# Patient Record
Sex: Male | Born: 2005
Health system: Southern US, Community
[De-identification: ages and names within clinical notes are randomized; demographics above are authoritative.]

## PROBLEM LIST (undated history)

## (undated) HISTORY — PX: CIRCUMCISION: SUR203

---
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
## 2024-02-06 DIAGNOSIS — Z0000 Encounter for general adult medical examination without abnormal findings: Secondary | ICD-10-CM | POA: Diagnosis not present

## 2024-02-15 ENCOUNTER — Encounter (INDEPENDENT_AMBULATORY_CARE_PROVIDER_SITE_OTHER): Payer: Self-pay | Admitting: Otolaryngology

## 2024-02-15 ENCOUNTER — Ambulatory Visit (INDEPENDENT_AMBULATORY_CARE_PROVIDER_SITE_OTHER): Admitting: Otolaryngology

## 2024-02-15 VITALS — BP 115/74 | HR 59 | Ht 75.0 in | Wt 165.0 lb

## 2024-02-15 DIAGNOSIS — J3089 Other allergic rhinitis: Secondary | ICD-10-CM | POA: Diagnosis not present

## 2024-02-15 DIAGNOSIS — K219 Gastro-esophageal reflux disease without esophagitis: Secondary | ICD-10-CM

## 2024-02-15 DIAGNOSIS — R0982 Postnasal drip: Secondary | ICD-10-CM

## 2024-02-15 DIAGNOSIS — J343 Hypertrophy of nasal turbinates: Secondary | ICD-10-CM

## 2024-02-15 DIAGNOSIS — J3501 Chronic tonsillitis: Secondary | ICD-10-CM

## 2024-02-15 DIAGNOSIS — R0981 Nasal congestion: Secondary | ICD-10-CM | POA: Diagnosis not present

## 2024-02-15 DIAGNOSIS — J351 Hypertrophy of tonsils: Secondary | ICD-10-CM

## 2024-02-15 DIAGNOSIS — Z8709 Personal history of other diseases of the respiratory system: Secondary | ICD-10-CM

## 2024-02-15 DIAGNOSIS — J342 Deviated nasal septum: Secondary | ICD-10-CM

## 2024-02-15 DIAGNOSIS — J312 Chronic pharyngitis: Secondary | ICD-10-CM

## 2024-02-15 MED ORDER — FLUTICASONE PROPIONATE 50 MCG/ACT NA SUSP: 2.0000 | Freq: Two times a day (BID) | NASAL | 6 refills | Status: AC

## 2024-02-15 NOTE — Patient Instructions (Addendum)
 Lloyd Huger Med Nasal Saline Rinse   - start nasal saline rinses with NeilMed Bottle available over the counter or online to help with nasal congestion     [email or web address] - check out this website to learn more about reflux   -Avoid lying down for at least two hours after a meal or after drinking acidic beverages, like soda, or other caffeinated beverages. This can help to prevent stomach contents from flowing back into the esophagus. -Keep your head elevated while you sleep. Using an extra pillow or two can also help to prevent reflux. -Eat smaller and more frequent meals each day instead of a few large meals. This promotes digestion and can aid in preventing heartburn. -Wear loose-fitting clothes to ease pressure on the stomach, which can worsen heartburn and reflux. -Reduce excess weight around the midsection. This can ease pressure on the stomach. Such pressure can force some stomach contents back up the esophagus  - Take Reflux Gourmet (natural supplement available on [employer]) to help with symptoms of chronic throat irritation

## 2024-02-15 NOTE — Progress Notes (Signed)
 ENT CONSULT:  Reason for Consult: chronic tonsillitis and hx of left PTA   HPI: Discussed the use of AI scribe software for clinical note transcription with the patient, who gave verbal consent to proceed.  History of Present Illness Caleb Bruce is a 18 year old male w/o major medical problems who presents with a history of peritonsillar abscess on the left and recurrent pharyngitis.   He had a peritonsillar abscess in February, which was successfully drained. This was his first occurrence, and he has not experienced any subsequent abscesses.  In May, he experienced pharyngitis, marking another rare episode of throat infection. He does not frequently experience sore throats or pharyngitis.  He manages his allergies with Allegra, taken during the fall and spring seasons. He experiences significant postnasal drainage. He has been noted to snore at night. No frequent strep throat infections are reported.   Records Reviewed:  ED note from 09/05/23 Caleb Bruce is a 18 y.o. male.   Pt complains of a sore throat and swelling.  Pt began having a sore throat 3 days ago.  Pt was seen and tested for influenza.  Pt had a positive test and was started on tamiflu.  Pt reports swelling in his throat yesterday.  Pt seen at urgent car and sent here.     The history is provided by the patient and a relative.  Sore Throat This is a new problem. The current episode started more than 2 days ago. The problem occurs constantly. Nothing aggravates the symptoms. Nothing relieves the symptoms. He has tried nothing for the symptoms. The treatment provided moderate relief.    Seen for pharyngitis on 12/18/23 at [facility]    No past medical history on file.  Past Surgical History:  Procedure Laterality Date   CIRCUMCISION      Family History  Problem Relation Age of Onset   Migraines Maternal Grandmother    Seizures Neg Hx    Autism Neg Hx    ADD / ADHD Neg Hx    Anxiety disorder Neg Hx     Depression Neg Hx    Bipolar disorder Neg Hx    Schizophrenia Neg Hx     Social History:  reports that he has never smoked. He has never used smokeless tobacco. No history on file for alcohol use and drug use.  Allergies: No Known Allergies  Medications: I have reviewed the patient's current medications.  The PMH, PSH, Medications, Allergies, and SH were reviewed and updated.  ROS: Constitutional: Negative for fever, weight loss and weight gain. Cardiovascular: Negative for chest pain and dyspnea on exertion. Respiratory: Is not experiencing shortness of breath at rest. Gastrointestinal: Negative for nausea and vomiting. Neurological: Negative for headaches. Psychiatric: The patient is not nervous/anxious  There were no vitals taken for this visit. There is no height or weight on file to calculate BMI.  PHYSICAL EXAM:  Exam: General: Well-developed, well-nourished Communication and Voice: Clear pitch and clarity Respiratory Respiratory effort: Equal inspiration and expiration without stridor Cardiovascular Peripheral Vascular: Warm extremities with equal color/perfusion Eyes: No nystagmus with equal extraocular motion bilaterally Neuro/Psych/Balance: Patient oriented to person, place, and time; Appropriate mood and affect; Gait is intact with no imbalance; Cranial nerves I-XII are intact Head and Face Inspection: Normocephalic and atraumatic without mass or lesion Palpation: Facial skeleton intact without bony stepoffs Salivary Glands: No mass or tenderness Facial Strength: Facial motility symmetric and full bilaterally ENT Pinna: External ear intact and fully developed External canal: Canal is patent  with intact skin Tympanic Membrane: Clear and mobile External Nose: No scar or anatomic deformity Internal Nose: Septum is S-shaped and deviated. No polyp, or purulence. Mucosal edema and erythema present.  Bilateral inferior turbinate hypertrophy.  Lips, Teeth, and gums:  Mucosa and teeth intact and viable TMJ: No pain to palpation with full mobility Oral cavity/oropharynx: No erythema or exudate, no lesions present 1+ tonsils no uvular deviation, no palate bulging no trismus Nasopharynx: No mass or lesion with intact mucosa Neck Neck and Trachea: Midline trachea without mass or lesion Thyroid: No mass or nodularity Lymphatics: No lymphadenopathy  Procedure:   PROCEDURE NOTE: nasal endoscopy  Preoperative diagnosis: chronic sinusitis symptoms  Postoperative diagnosis: same  Procedure: Diagnostic nasal endoscopy (68768)  Surgeon: Elena Larry, M.D.  Anesthesia: Topical lidocaine and Afrin  H&P REVIEW: The patient's history and physical were reviewed today prior to procedure. All medications were reviewed and updated as well. Complications: None Condition is stable throughout exam Indications and consent: The patient presents with symptoms of chronic sinusitis not responding to previous therapies. All the risks, benefits, and potential complications were reviewed with the patient preoperatively and informed consent was obtained. The time out was completed with confirmation of the correct procedure.   Procedure: The patient was seated upright in the clinic. Topical lidocaine and Afrin were applied to the nasal cavity. After adequate anesthesia had occurred, the rigid nasal endoscope was passed into the nasal cavity. The nasal mucosa, turbinates, septum, and sinus drainage pathways were visualized bilaterally. This revealed no purulence or significant secretions that might be cultured. There were no polyps or sites of significant inflammation. The mucosa was intact and there was no crusting present. The scope was then slowly withdrawn and the patient tolerated the procedure well. There were no complications or blood loss.      Studies Reviewed: CT neck 09/05/23 IMPRESSION: Left palatine peritonsillar abscess measuring 2.6 x 1.8 x 3.7  cm.  Assessment/Plan: No diagnosis found.  Assessment and Plan Assessment & Plan Chronic nasal congestion and Postnasal drainage Postnasal drainage likely due to allergies, could contribute to chronic sore throat. - Recommend saline rinses with NeilMed bottle using distilled water and salt packet. - Continue Allegra if effective. - Start Flonase  twice a day. - Consider referral for allergy testing if desired in the future   GERD LPR silent reflux  Could be contributing to chronic sore throat   -  Reflux Gourmet after meals - diet and lifestyle changes to minimize GERD - Refer to [employer] blog for dietary and lifestyle modifications/reflux cook book - Advise dietary and lifestyle modifications to manage potential silent reflux, including avoiding late meals, caffeinated beverages, and spicy or fried foods.    Peritonsillar abscess x 1 left side and pharyngitis in May 2025.  Single episode in February; no current indication for tonsillectomy. Tonsils are small and not inflamed on exam. He denies hx of recurrent strep throat.  - Consider tonsillectomy if a second peritonsillar abscess occurs or if he develops frequent throat infections   Thank you for allowing me to participate in the care of this patient. Please do not hesitate to contact me with any questions or concerns.   Elena Larry, MD Otolaryngology [facility] Phone: [phone] Fax: [phone]    02/15/2024, 8:05 AM
